# Patient Record
Sex: Female | Born: 1995 | Hispanic: Refuse to answer | Marital: Single | Smoking: Never smoker
Health system: Southern US, Community
[De-identification: ages and names within clinical notes are randomized; demographics above are authoritative.]

---

## 2016-08-06 ENCOUNTER — Encounter: Payer: Self-pay | Admitting: Physician Assistant

## 2016-08-06 ENCOUNTER — Ambulatory Visit (INDEPENDENT_AMBULATORY_CARE_PROVIDER_SITE_OTHER): Payer: Commercial Managed Care - HMO | Admitting: Physician Assistant

## 2016-08-06 VITALS — BP 107/69 | HR 90 | Temp 98.6°F | Resp 18 | Ht 63.0 in | Wt 105.6 lb

## 2016-08-06 DIAGNOSIS — Z3A01 Less than 8 weeks gestation of pregnancy: Secondary | ICD-10-CM | POA: Diagnosis not present

## 2016-08-06 DIAGNOSIS — N912 Amenorrhea, unspecified: Secondary | ICD-10-CM | POA: Diagnosis not present

## 2016-08-06 LAB — POCT URINE PREGNANCY: Preg Test, Ur: POSITIVE — AB

## 2016-08-06 NOTE — Progress Notes (Signed)
   Ezzard StandingRachel Simmonds  MRN: [id number] DOB: 07/27/1995  PCP: Patient, No Pcp Per  Chief Complaint  Patient presents with  . Amenorrhea    Subjective:  Pt presents to clinic for confirmed pregnancy test.  She took a test last night and it was positive.   She had an IUD that had to be taken out due to misplacement that it had to be removed about 8 months ago.  She has been using nothing for birth control since then.  She is in a serious relationship.  She plans to terminate the pregnancy.  LMP 4/8 - irregular menses history  Review of Systems  There are no active problems to display for this patient.   No current outpatient prescriptions on file prior to visit.   No current facility-administered medications on file prior to visit.     No Known Allergies  Pt patients past, family and social history were reviewed and updated.   Objective:  BP 107/69   Pulse 90   Temp 98.6 F (37 C) (Oral)   Resp 18   Ht 5\' 3"  (1.6 m)   Wt 105 lb 9.6 oz (47.9 kg)   LMP 06/22/2016   SpO2 96%   BMI 18.71 kg/m   Physical Exam  Constitutional: She is oriented to person, place, and time and well-developed, well-nourished, and in no distress.  HENT:  Head: Normocephalic and atraumatic.  Right Ear: Hearing and external ear normal.  Left Ear: Hearing and external ear normal.  Eyes: Conjunctivae are normal.  Neck: Normal range of motion.  Pulmonary/Chest: Effort normal.  Neurological: She is alert and oriented to person, place, and time. Gait normal.  Skin: Skin is warm and dry.  Psychiatric: Mood, memory, affect and judgment normal.  Vitals reviewed.   Assessment and Plan :  Amenorrhea - Plan: POCT urine pregnancy, Beta HCG, Quant, Care order/instruction:  Less than [redacted] weeks gestation of pregnancy   Pt is interested in termination - we discussed different options - we discussed different birth control options for her after the procedure.  Benny LennertSarah Reneka Nebergall PA-C  [facility] 08/06/2016 11:31 AM

## 2016-08-06 NOTE — Patient Instructions (Addendum)
[id number]  I will contact you with your lab results as soon as they are available.   If you have not heard from me in 2 weeks, please contact me.  The fastest way to get your results is to register for My Chart (see the instructions on the last page of this printout).    IF you received an x-ray today, you will receive an invoice from [facility]. Please contact [facility] at [phone] with questions or concerns regarding your invoice.   IF you received labwork today, you will receive an invoice from [facility]. Please contact [facility] at [phone] with questions or concerns regarding your invoice.   Our billing staff will not be able to assist you with questions regarding bills from these companies.  You will be contacted with the lab results as soon as they are available. The fastest way to get your results is to activate your My Chart account. Instructions are located on the last page of this paperwork. If you have not heard from us regarding the results in 2 weeks, please contact this office.

## 2016-08-07 ENCOUNTER — Telehealth: Payer: Self-pay | Admitting: Physician Assistant

## 2016-08-07 LAB — BETA HCG QUANT (REF LAB): HCG QUANT: 27351 m[IU]/mL

## 2016-08-07 NOTE — Telephone Encounter (Signed)
Please advise, some level was drawn too, comments?

## 2016-08-07 NOTE — Telephone Encounter (Signed)
PATIENT STATES SHE CAME IN Wednesday (08/06/16) AND SHE SAW SARAH WEBER. SARAH DID A BLOOD PREGNANCY TEST AND TOLD HER TO CALL BACK TODAY FOR THE RESULTS. BEST PHONE [phone] (CELL) MBC

## 2016-08-08 NOTE — Telephone Encounter (Signed)
Please see lab results where I commented on her results.

## 2016-08-09 ENCOUNTER — Telehealth: Payer: Self-pay | Admitting: General Practice

## 2016-08-09 NOTE — Telephone Encounter (Signed)
Pt is needing to get her lab results   Best number [phone]

## 2016-08-12 NOTE — Telephone Encounter (Signed)
See lab note.  

## 2016-11-12 ENCOUNTER — Other Ambulatory Visit: Payer: Self-pay | Admitting: Physician Assistant

## 2016-11-12 DIAGNOSIS — R519 Headache, unspecified: Secondary | ICD-10-CM

## 2016-11-12 DIAGNOSIS — R51 Headache: Principal | ICD-10-CM

## 2016-12-04 ENCOUNTER — Ambulatory Visit
Admission: RE | Admit: 2016-12-04 | Discharge: 2016-12-04 | Disposition: A | Discharge status: Home / Self Care | Payer: 59 | Source: Ambulatory Visit | Attending: Physician Assistant | Admitting: Physician Assistant

## 2016-12-04 DIAGNOSIS — R51 Headache: Principal | ICD-10-CM

## 2016-12-04 DIAGNOSIS — R519 Headache, unspecified: Secondary | ICD-10-CM

## 2018-05-13 IMAGING — MR MR HEAD W/O CM
8 series · 48 of 48 positions shown · non-contrast
Comparison: None.

CLINICAL DATA: Persistent headaches

EXAM:
MRI HEAD WITHOUT CONTRAST
TECHNIQUE: Multiplanar, multiecho pulse sequences of the brain and surrounding
structures were obtained without intravenous contrast.

[Series 2: T1 · sagittal · 5.0mm · 0.45mm/px · 3 of 21 slices shown]
[im 1/21]
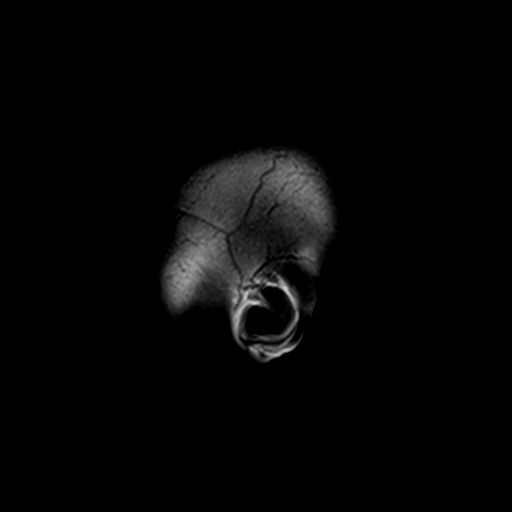
[im 11/21]
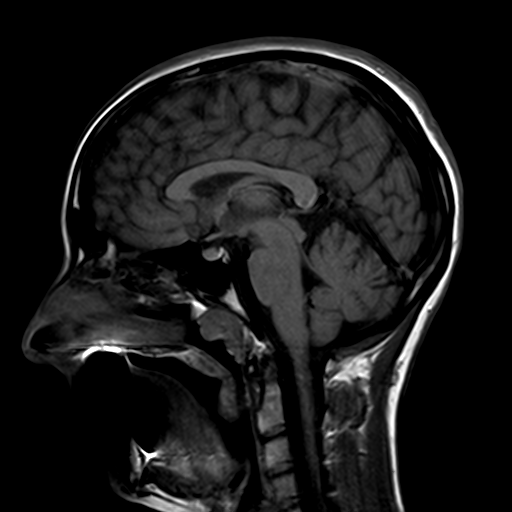
[im 21/21]
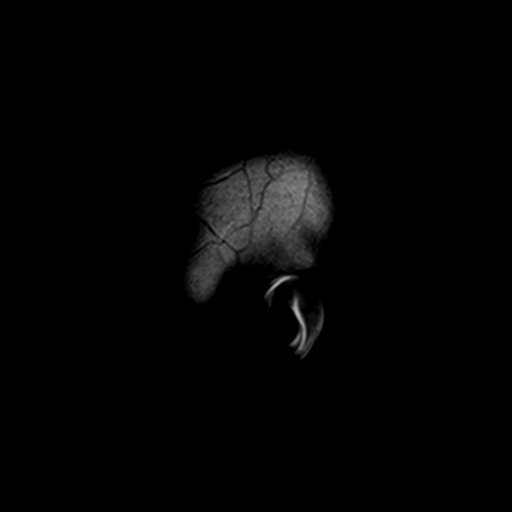

[Series 3: DWI · axial · 3.0mm · 1.80mm/px · z∈[-66,+81]mm · 11 of 100 slices shown (1 of 2)]
[im 1/100]
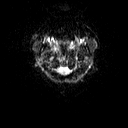
[im 10/100]
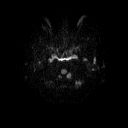
[im 20/100]
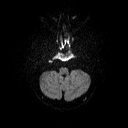
[im 30/100]
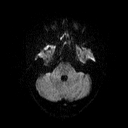
[im 40/100]
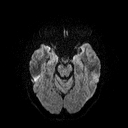
[im 50/100]
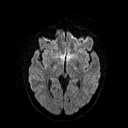
[im 60/100]
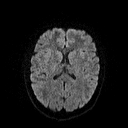
[im 70/100]
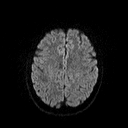
[im 80/100]
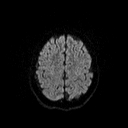
[im 90/100]
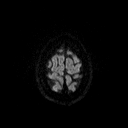
[im 100/100]
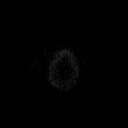

[Series 4: DWI · axial · 3.0mm · 1.80mm/px · z∈[-66,+81]mm · 6 of 49 slices shown (2 of 2)]
[im 1/49]
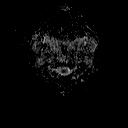
[im 10/49]
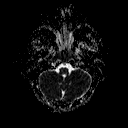
[im 20/49]
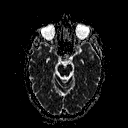
[im 29/49]
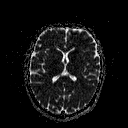
[im 39/49]
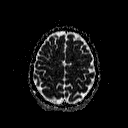
[im 49/49]
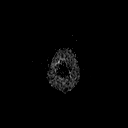

[Series 5: T2 · axial · 5.0mm · 0.60mm/px · z∈[-66,+81]mm · 3 of 22 slices shown (1 of 2)]
[im 1/22]
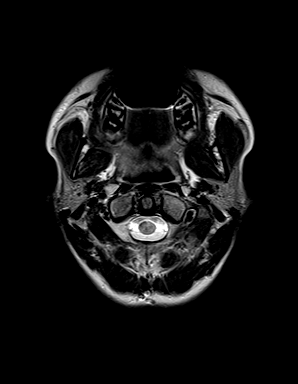
[im 11/22]
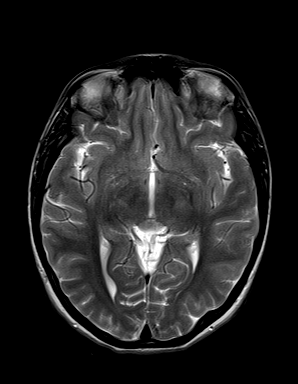
[im 22/22]
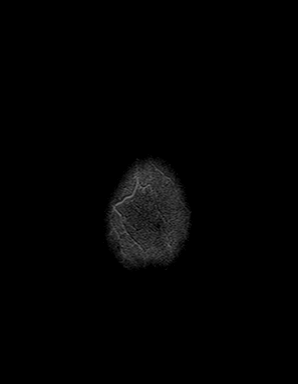

[Series 6: FLAIR · axial · 3.0mm · 0.45mm/px · z∈[-60,+75]mm · 3 of 30 slices shown]
[im 1/30]
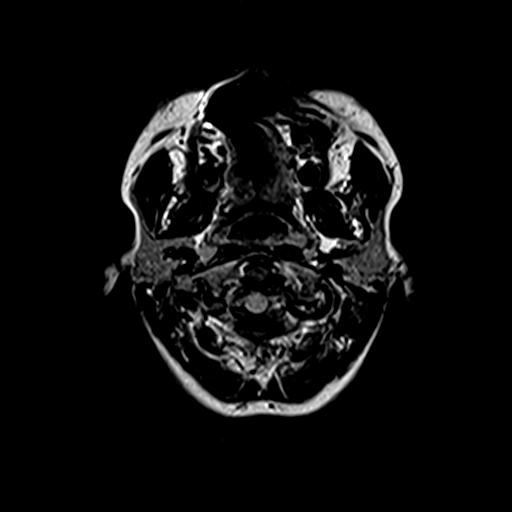
[im 15/30]
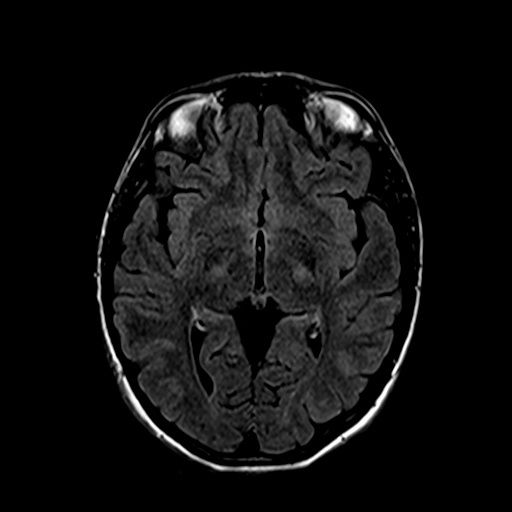
[im 30/30]
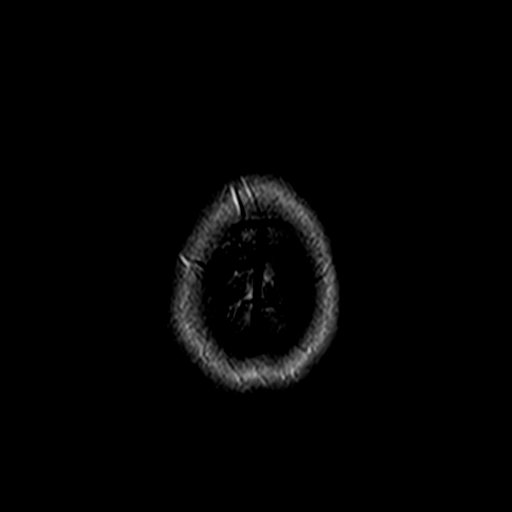

[Series 8: swi_images · axial · 5.0mm · 0.90mm/px · z∈[-65,+80]mm · 3 of 30 slices shown]
[im 1/30]
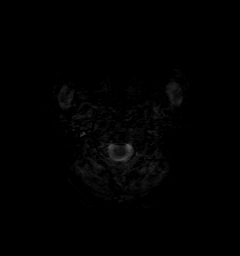
[im 15/30]
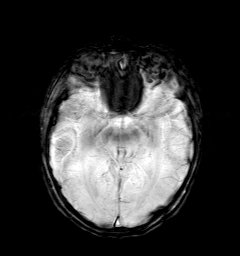
[im 30/30]
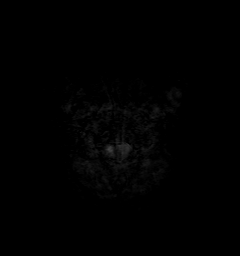

[Series 9: t1_mpr_tra · axial · 1.0mm · 0.71mm/px · z∈[-63,+79]mm · 16 of 144 slices shown]
[im 1/144]
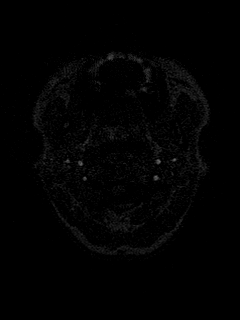
[im 10/144]
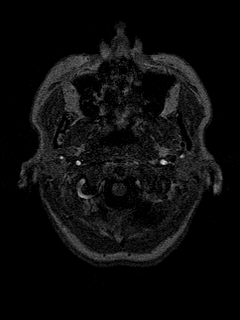
[im 20/144]
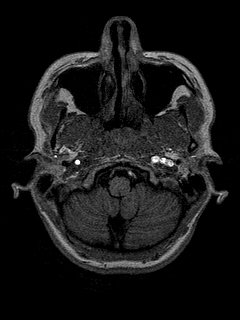
[im 29/144]
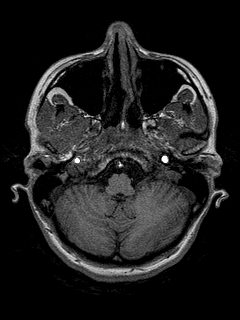
[im 39/144]
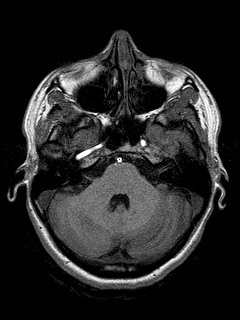
[im 48/144]
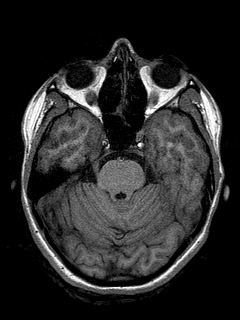
[im 58/144]
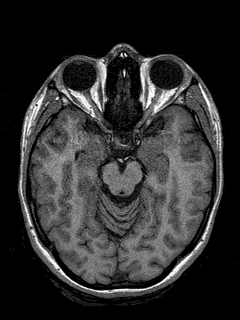
[im 67/144]
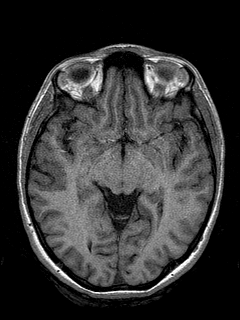
[im 77/144]
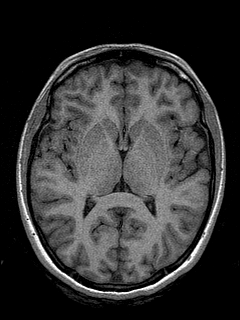
[im 86/144]
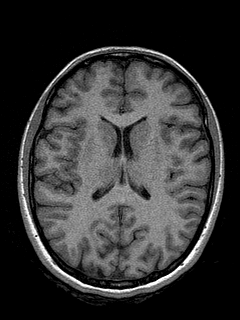
[im 96/144]
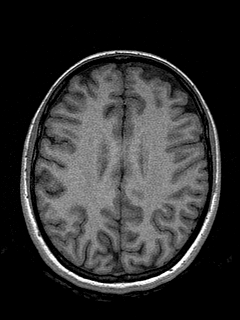
[im 105/144]
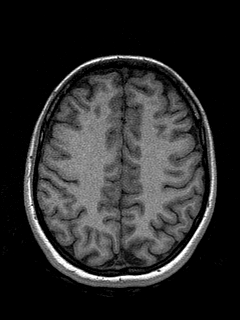
[im 115/144]
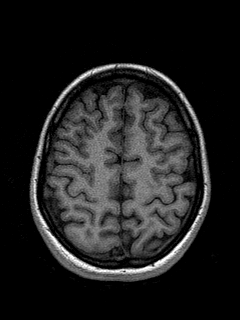
[im 124/144]
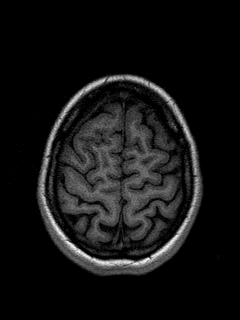
[im 134/144]
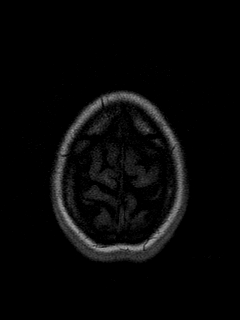
[im 144/144]
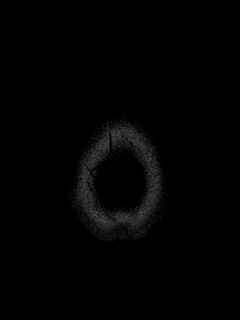

[Series 10: T2 · coronal · 5.0mm · 0.45mm/px · 3 of 25 slices shown (2 of 2)]
[im 1/25]
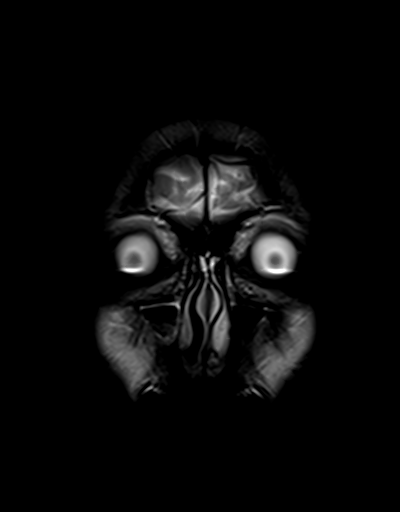
[im 13/25]
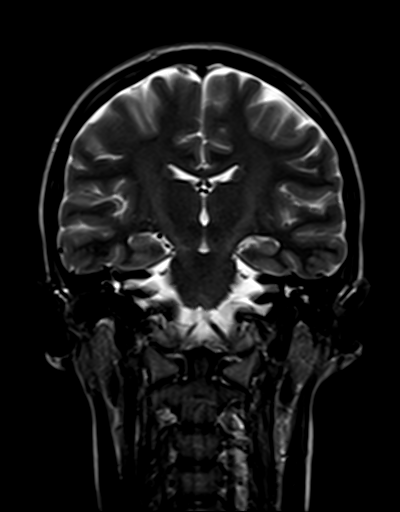
[im 25/25]
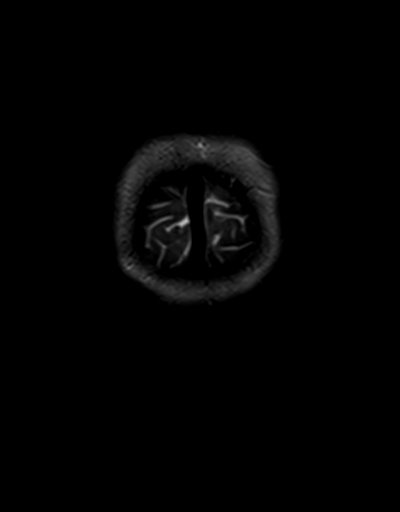

[48 of 48 positions shown; findings below may reference images not displayed]

FINDINGS: Brain: No acute infarction, hemorrhage, hydrocephalus, extra-axial
collection or mass lesion.

Vascular: Normal flow voids.

Skull and upper cervical spine: Negative

Sinuses/Orbits: Adenoid hypertrophy. Mild mucosal edema paranasal
sinuses. Normal orbit.

Other: None
IMPRESSION: Normal MRI of the brain

Mild sinus mucosal disease
